# Patient Record
Sex: Male | Born: 1945 | Race: White | Hispanic: No | Marital: Married | Smoking: Current every day smoker
Health system: Southern US, Community
[De-identification: ages and names within clinical notes are randomized; demographics above are authoritative.]

## PROBLEM LIST (undated history)

## (undated) DIAGNOSIS — E7800 Pure hypercholesterolemia, unspecified: Secondary | ICD-10-CM

## (undated) DIAGNOSIS — I10 Essential (primary) hypertension: Secondary | ICD-10-CM

---

## 2016-04-01 ENCOUNTER — Encounter: Payer: Self-pay | Admitting: Emergency Medicine

## 2016-04-01 ENCOUNTER — Emergency Department
Admission: EM | Admit: 2016-04-01 | Discharge: 2016-04-01 | Disposition: A | Discharge status: Home / Self Care | Payer: Medicare Other | Attending: Emergency Medicine | Admitting: Emergency Medicine

## 2016-04-01 DIAGNOSIS — I10 Essential (primary) hypertension: Secondary | ICD-10-CM | POA: Insufficient documentation

## 2016-04-01 DIAGNOSIS — R339 Retention of urine, unspecified: Secondary | ICD-10-CM | POA: Diagnosis present

## 2016-04-01 DIAGNOSIS — F17200 Nicotine dependence, unspecified, uncomplicated: Secondary | ICD-10-CM | POA: Diagnosis not present

## 2016-04-01 HISTORY — DX: Pure hypercholesterolemia, unspecified: E78.00

## 2016-04-01 HISTORY — DX: Essential (primary) hypertension: I10

## 2016-04-01 LAB — URINALYSIS COMPLETE WITH MICROSCOPIC (ARMC ONLY)
BACTERIA UA: NONE SEEN
BILIRUBIN URINE: NEGATIVE
Glucose, UA: NEGATIVE mg/dL
Nitrite: NEGATIVE
PH: 6 (ref 5.0–8.0)
Protein, ur: 30 mg/dL — AB
SQUAMOUS EPITHELIAL / LPF: NONE SEEN
Specific Gravity, Urine: 1.018 (ref 1.005–1.030)

## 2016-04-01 MED ORDER — LIDOCAINE HCL 2 % EX GEL
1.0000 "application " | Freq: Once | CUTANEOUS | Status: AC
  Administered 2016-04-01: 1 via URETHRAL

## 2016-04-01 MED ORDER — LIDOCAINE HCL 2 % EX GEL
CUTANEOUS | Status: AC
  Administered 2016-04-01: 1 via URETHRAL
  Filled 2016-04-01: qty 10

## 2016-04-01 NOTE — ED Provider Notes (Signed)
[facility] Emergency Department Provider Note   ____________________________________________  Time seen: Approximately 11:27 PM  I have reviewed the triage vital signs and the nursing notes.   HISTORY  Chief Complaint Urinary Retention    HPI Jonathan Mora is a 70 y.o. male patient says she had radiofrequency heating of his prostate last week. He had the Foley pulled out this morning. He has been unable to pass urine for the last few hours and isn't extreme pain. He has had no other problems except for that. Nothing he does make some urine come out thing he does makes the pain any better again he has no other symptoms except for bladder pain from being unable to pass her urine.  } Past Medical History  Diagnosis Date  . High cholesterol   . Hypertension     There are no active problems to display for this patient.   History reviewed. No pertinent past surgical history.  No current outpatient prescriptions on file.  Allergies Sulfa antibiotics  History reviewed. No pertinent family history.  Social History Social History  Substance Use Topics  . Smoking status: Current Every Day Smoker  . Smokeless tobacco: None  . Alcohol Use: No    Review of Systems Constitutional: No fever/chills Eyes: No visual changes. ENT: No sore throat. Cardiovascular: Denies chest pain. Respiratory: Denies shortness of breath. Gastrointestinal:  abdominal pain.  No nausea, no vomiting.  No diarrhea.  No constipation. Genitourinary: Negative for dysuria. Musculoskeletal: Negative for back pain. Skin: Negative for rash. Neurological: Negative for headaches, focal weakness or numbness.  10-point ROS otherwise negative.  ____________________________________________   PHYSICAL EXAM:  VITAL SIGNS: ED Triage Vitals  Enc Vitals Group     BP --      Pulse --      Resp --      Temp --      Temp src --      SpO2 --      Weight --      Height --    Head Cir --      Peak Flow --      Pain Score 04/01/16 2145 10     Pain Loc --      Pain Edu? --      Excl. in [place]? --    Constitutional: Alert and oriented. Well appearing and in no acute distress. Eyes: Conjunctivae are normal. PERRL. EOMI. Head: Atraumatic. Nose: No congestion/rhinnorhea. Mouth/Throat: Mucous membranes are moist.  Oropharynx non-erythematous. Neck: No stridor.  Gastrointestinal: Soft C is distended and tender. No distention. No abdominal bruits. No CVA tenderness. Genitourinary: Normal uncircumcised male  Musculoskeletal: No lower extremity tenderness nor edema.  No joint effusions. Neurologic:  Normal speech and language. No gross focal neurologic deficits are appreciated. No gait instability. Skin:  Skin is warm, dry and intact. No rash noted. Psychiatric: Mood and affect are normal. Speech and behavior are normal.  ____________________________________________   LABS (all labs ordered are listed, but only abnormal results are displayed)  Labs Reviewed  URINALYSIS COMPLETEWITH MICROSCOPIC ([place]) - Abnormal; Notable for the following:    Color, Urine YELLOW (*)    APPearance CLEAR (*)    Ketones, ur TRACE (*)    Hgb urine dipstick 3+ (*)    Protein, ur 30 (*)    Leukocytes, UA TRACE (*)    All other components within normal limits   ____________________________________________  EKG   ____________________________________________  RADIOLOGY   ____________________________________________  PROCEDURES  Procedure(s) Nurse unable to pass the Foley therefore I was called. I instilled about 5 ml of Urojet lidocaine into the patient's penis urethra and held at shot for about a minute and then I instilled some lubricant and put more lubricant on the daily catheter which nurse obtained for me. I was then able to using gradual gentle pressure insert the Foley/daily into the patient's urethra until I was able to obtain urine. Patient experienced immediate  relief of his symptoms. ____________________________________________   INITIAL IMPRESSION / ASSESSMENT AND PLAN / ED COURSE  Pertinent labs & imaging results that were available during my care of the patient were reviewed by me and considered in my medical decision making (see chart for details).   ____________________________________________   FINAL CLINICAL IMPRESSION(S) / ED DIAGNOSES  Final diagnoses:  Urinary retention      NEW MEDICATIONS STARTED DURING THIS VISIT:  New Prescriptions   No medications on file     Note:  This document was prepared using Dragon voice recognition software and may include unintentional dictation errors.    Nena Polio, MD 04/01/16 [phone]

## 2016-04-01 NOTE — ED Notes (Signed)
Pt presents to ED via EMS for acute urinary retention after foley catheter removed this morning around 900. Pt states had a prostate procedure done about few weeks ago by Dr. Gilmore Laroche at [facility] in [place] and he was sent home with foley. Bladder scan showed >638m in bladder.

## 2016-08-02 ENCOUNTER — Other Ambulatory Visit: Payer: Self-pay | Admitting: Family Medicine

## 2016-08-02 DIAGNOSIS — Z136 Encounter for screening for cardiovascular disorders: Secondary | ICD-10-CM

## 2016-08-13 ENCOUNTER — Other Ambulatory Visit: Payer: Medicare Other

## 2016-08-13 ENCOUNTER — Ambulatory Visit
Admission: RE | Admit: 2016-08-13 | Discharge: 2016-08-13 | Disposition: A | Discharge status: Home / Self Care | Payer: Medicare Other | Source: Ambulatory Visit | Attending: Family Medicine | Admitting: Family Medicine

## 2016-08-13 DIAGNOSIS — Z136 Encounter for screening for cardiovascular disorders: Secondary | ICD-10-CM | POA: Diagnosis not present

## 2016-08-13 DIAGNOSIS — Z720 Tobacco use: Secondary | ICD-10-CM | POA: Insufficient documentation

## 2017-03-12 ENCOUNTER — Telehealth: Payer: Self-pay | Admitting: *Deleted

## 2017-03-12 DIAGNOSIS — Z87891 Personal history of nicotine dependence: Secondary | ICD-10-CM

## 2017-03-12 NOTE — Telephone Encounter (Signed)
Received referral for initial lung cancer screening scan. Contacted patient and obtained smoking history,(current, 50 pack year) as well as answering questions related to screening process. Patient denies signs of lung cancer such as weight loss or hemoptysis. Patient denies comorbidity that would prevent curative treatment if lung cancer were found. Patient is tentatively scheduled for shared decision making visit and CT scan on 03/18/17, pending insurance approval from business office.

## 2017-03-18 ENCOUNTER — Encounter: Payer: Self-pay | Admitting: Oncology

## 2017-03-18 ENCOUNTER — Ambulatory Visit
Admission: RE | Admit: 2017-03-18 | Discharge: 2017-03-18 | Disposition: A | Discharge status: Home / Self Care | Payer: Medicare Other | Source: Ambulatory Visit | Attending: Oncology | Admitting: Oncology

## 2017-03-18 ENCOUNTER — Inpatient Hospital Stay: Payer: Medicare Other | Attending: Oncology | Admitting: Oncology

## 2017-03-18 DIAGNOSIS — Z122 Encounter for screening for malignant neoplasm of respiratory organs: Secondary | ICD-10-CM | POA: Insufficient documentation

## 2017-03-18 DIAGNOSIS — Z87891 Personal history of nicotine dependence: Secondary | ICD-10-CM | POA: Insufficient documentation

## 2017-03-18 DIAGNOSIS — J439 Emphysema, unspecified: Secondary | ICD-10-CM | POA: Diagnosis not present

## 2017-03-18 DIAGNOSIS — I700 Atherosclerosis of aorta: Secondary | ICD-10-CM | POA: Diagnosis not present

## 2017-03-18 DIAGNOSIS — R918 Other nonspecific abnormal finding of lung field: Secondary | ICD-10-CM | POA: Diagnosis not present

## 2017-03-18 DIAGNOSIS — F17210 Nicotine dependence, cigarettes, uncomplicated: Secondary | ICD-10-CM | POA: Diagnosis not present

## 2017-03-21 ENCOUNTER — Telehealth: Payer: Self-pay | Admitting: *Deleted

## 2017-03-21 NOTE — Telephone Encounter (Signed)
Voicemail left in attempt to notify patient of LDCT lung cancer screening results with recommendation for 3 month follow up imaging. Also upon return call will be notified of incidental finding noted below and encouraged to discuss with PCP who will receive this report.  IMPRESSION: 1. Multiple large nodular appearing areas of architectural distortion are again noted throughout the right lung, most evident in the right upper lobe where they are associated with areas of cylindrical bronchiectasis, strongly favored to represent areas of chronic post infectious or inflammatory scarring. Strictly speaking, based on size alone, these are classified as Lung-RADS Category 4A, suspicious. Follow up low-dose chest CT without contrast in 3 months (please use the following order, "CT CHEST LCS NODULE FOLLOW-UP W/O CM") is recommended. Alternatively, PET may be considered but is not strongly recommended at this time. 2. Aortic atherosclerosis. 3. Mild diffuse bronchial wall thickening with mild centrilobular and paraseptal emphysema.

## 2017-03-23 DIAGNOSIS — Z87891 Personal history of nicotine dependence: Secondary | ICD-10-CM | POA: Insufficient documentation

## 2017-03-23 NOTE — Progress Notes (Signed)
In accordance with CMS guidelines, patient has met eligibility criteria including age, absence of signs or symptoms of lung cancer.  Social History  Substance Use Topics  . Smoking status: Current Every Day Smoker    Packs/day: 1.00    Years: 50.00  . Smokeless tobacco: Not on file  . Alcohol use No     A shared decision-making session was conducted prior to the performance of CT scan. This includes one or more decision aids, includes benefits and harms of screening, follow-up diagnostic testing, over-diagnosis, false positive rate, and total radiation exposure.  Counseling on the importance of adherence to annual lung cancer LDCT screening, impact of co-morbidities, and ability or willingness to undergo diagnosis and treatment is imperative for compliance of the program.  Counseling on the importance of continued smoking cessation for former smokers; the importance of smoking cessation for current smokers, and information about tobacco cessation interventions have been given to patient including [place] and [employer] quit [place] programs.  Written order for lung cancer screening with LDCT has been given to the patient and any and all questions have been answered to the best of my abilities.   Yearly follow up will be coordinated by Burgess Estelle, Thoracic Navigator.

## 2017-03-31 ENCOUNTER — Ambulatory Visit
Admission: RE | Admit: 2017-03-31 | Discharge: 2017-03-31 | Disposition: A | Discharge status: Home / Self Care | Payer: Self-pay | Source: Ambulatory Visit | Attending: Oncology | Admitting: Oncology

## 2017-03-31 ENCOUNTER — Other Ambulatory Visit: Payer: Self-pay | Admitting: Oncology

## 2017-03-31 DIAGNOSIS — C3400 Malignant neoplasm of unspecified main bronchus: Secondary | ICD-10-CM

## 2017-04-02 NOTE — Telephone Encounter (Signed)
After reviewing results of CT scan with patient, he indicated that he had a prior scan at a [facility] facility that had a similar abnormality. Those images were obtained and compared by the original radiologist that read the recent scan. The Lung rads finding has been changed to reflect a 12 follow up imaging recommendation. The patient has been notified.

## 2018-03-12 ENCOUNTER — Telehealth: Payer: Self-pay | Admitting: *Deleted

## 2018-03-12 NOTE — Telephone Encounter (Signed)
Left message for patient to notify them that it is time to schedule annual low dose lung cancer screening CT scan. Instructed patient to call back to verify information prior to the scan being scheduled.  

## 2018-03-16 ENCOUNTER — Telehealth: Payer: Self-pay | Admitting: *Deleted

## 2018-03-16 DIAGNOSIS — Z87891 Personal history of nicotine dependence: Secondary | ICD-10-CM

## 2018-03-16 DIAGNOSIS — Z122 Encounter for screening for malignant neoplasm of respiratory organs: Secondary | ICD-10-CM

## 2018-03-16 NOTE — Telephone Encounter (Signed)
Notified patient that annual lung cancer screening low dose CT scan is due currently or will be in near future. Confirmed that patient is within the age range of 55-77, and asymptomatic, (no signs or symptoms of lung cancer). Patient denies illness that would prevent curative treatment for lung cancer if found. Verified smoking history, (current, 50.25 pack year). The shared decision making visit was done 03/18/17. Patient is agreeable for CT scan being scheduled.

## 2018-03-26 ENCOUNTER — Ambulatory Visit
Admission: RE | Admit: 2018-03-26 | Discharge: 2018-03-26 | Disposition: A | Discharge status: Home / Self Care | Payer: Medicare Other | Source: Ambulatory Visit | Attending: Nurse Practitioner | Admitting: Nurse Practitioner

## 2018-03-26 DIAGNOSIS — I700 Atherosclerosis of aorta: Secondary | ICD-10-CM | POA: Insufficient documentation

## 2018-03-26 DIAGNOSIS — J439 Emphysema, unspecified: Secondary | ICD-10-CM | POA: Insufficient documentation

## 2018-03-26 DIAGNOSIS — Z122 Encounter for screening for malignant neoplasm of respiratory organs: Secondary | ICD-10-CM | POA: Diagnosis present

## 2018-03-26 DIAGNOSIS — Z87891 Personal history of nicotine dependence: Secondary | ICD-10-CM | POA: Diagnosis present

## 2018-03-30 ENCOUNTER — Encounter: Payer: Self-pay | Admitting: *Deleted

## 2019-02-20 ENCOUNTER — Encounter: Payer: Self-pay | Admitting: *Deleted

## 2019-02-25 ENCOUNTER — Encounter: Payer: Self-pay | Admitting: *Deleted

## 2019-05-05 ENCOUNTER — Telehealth: Payer: Self-pay | Admitting: *Deleted

## 2019-05-05 DIAGNOSIS — Z87891 Personal history of nicotine dependence: Secondary | ICD-10-CM

## 2019-05-05 DIAGNOSIS — Z122 Encounter for screening for malignant neoplasm of respiratory organs: Secondary | ICD-10-CM

## 2019-05-05 NOTE — Telephone Encounter (Signed)
Patient has been notified that annual lung cancer screening low dose CT scan is due currently or will be in near future. Confirmed that patient is within the age range of 55-77, and asymptomatic, (no signs or symptoms of lung cancer). Patient denies illness that would prevent curative treatment for lung cancer if found. Verified smoking history, (former, quit 01/31/19, 50.25 pack year). The shared decision making visit was done 03/18/17. Patient is agreeable for CT scan being scheduled.

## 2019-05-11 ENCOUNTER — Other Ambulatory Visit: Payer: Self-pay

## 2019-05-11 ENCOUNTER — Ambulatory Visit
Admission: RE | Admit: 2019-05-11 | Discharge: 2019-05-11 | Disposition: A | Discharge status: Home / Self Care | Payer: Medicare Other | Source: Ambulatory Visit | Attending: Nurse Practitioner | Admitting: Nurse Practitioner

## 2019-05-11 DIAGNOSIS — Z87891 Personal history of nicotine dependence: Secondary | ICD-10-CM | POA: Diagnosis present

## 2019-05-11 DIAGNOSIS — Z122 Encounter for screening for malignant neoplasm of respiratory organs: Secondary | ICD-10-CM | POA: Diagnosis present

## 2019-05-12 ENCOUNTER — Encounter: Payer: Self-pay | Admitting: *Deleted

## 2019-10-20 ENCOUNTER — Other Ambulatory Visit: Payer: Self-pay | Admitting: Family Medicine

## 2019-10-20 DIAGNOSIS — R17 Unspecified jaundice: Secondary | ICD-10-CM

## 2019-10-27 ENCOUNTER — Encounter (INDEPENDENT_AMBULATORY_CARE_PROVIDER_SITE_OTHER): Payer: Self-pay

## 2019-10-27 ENCOUNTER — Ambulatory Visit
Admission: RE | Admit: 2019-10-27 | Discharge: 2019-10-27 | Disposition: A | Discharge status: Home / Self Care | Payer: Medicare Other | Source: Ambulatory Visit | Attending: Family Medicine | Admitting: Family Medicine

## 2019-10-27 ENCOUNTER — Other Ambulatory Visit: Payer: Self-pay

## 2019-10-27 DIAGNOSIS — R17 Unspecified jaundice: Secondary | ICD-10-CM | POA: Diagnosis not present

## 2019-11-15 ENCOUNTER — Other Ambulatory Visit: Payer: Self-pay | Admitting: Family Medicine

## 2019-11-15 DIAGNOSIS — N281 Cyst of kidney, acquired: Secondary | ICD-10-CM

## 2020-04-24 ENCOUNTER — Ambulatory Visit
Admission: RE | Admit: 2020-04-24 | Discharge: 2020-04-24 | Disposition: A | Discharge status: Home / Self Care | Payer: Medicare Other | Source: Ambulatory Visit | Attending: Family Medicine | Admitting: Family Medicine

## 2020-04-24 ENCOUNTER — Other Ambulatory Visit: Payer: Self-pay

## 2020-04-24 DIAGNOSIS — N281 Cyst of kidney, acquired: Secondary | ICD-10-CM | POA: Insufficient documentation

## 2020-04-26 ENCOUNTER — Ambulatory Visit: Payer: Medicare Other

## 2020-05-03 ENCOUNTER — Telehealth: Payer: Self-pay

## 2020-05-03 NOTE — Telephone Encounter (Signed)
Patient has been notified that the low dose lung cancer screening CT scan is due currently or will be in near future.  He is being scheduled for prostate MRI in the next few weeks and would like to wait for the lung screening CT scan.

## 2020-06-26 ENCOUNTER — Telehealth: Payer: Self-pay

## 2020-06-26 NOTE — Telephone Encounter (Signed)
Message left notifying patient that it is time to schedule the low dose lung cancer screening CT scan.  Instructed patient to return call to Burgess Estelle at [phone] to verify information prior to CT scan being scheduled.

## 2020-06-29 ENCOUNTER — Telehealth: Payer: Self-pay | Admitting: *Deleted

## 2020-06-29 DIAGNOSIS — Z87891 Personal history of nicotine dependence: Secondary | ICD-10-CM

## 2020-06-29 DIAGNOSIS — Z122 Encounter for screening for malignant neoplasm of respiratory organs: Secondary | ICD-10-CM

## 2020-06-29 NOTE — Telephone Encounter (Signed)
Patient has been notified that annual lung cancer screening low dose CT scan is due currently or will be in near future. Confirmed that patient is within the age range of 55-77, and asymptomatic, (no signs or symptoms of lung cancer). Patient denies illness that would prevent curative treatment for lung cancer if found. Verified smoking history, (former, quit 01/31/19, 50.25 pack year). The shared decision making visit was done 03/18/17. Patient is agreeable for CT scan being scheduled.

## 2020-07-19 ENCOUNTER — Ambulatory Visit
Admission: RE | Admit: 2020-07-19 | Discharge: 2020-07-19 | Disposition: A | Discharge status: Home / Self Care | Payer: Medicare Other | Source: Ambulatory Visit | Attending: Nurse Practitioner | Admitting: Nurse Practitioner

## 2020-07-19 ENCOUNTER — Other Ambulatory Visit: Payer: Self-pay

## 2020-07-19 DIAGNOSIS — Z87891 Personal history of nicotine dependence: Secondary | ICD-10-CM | POA: Insufficient documentation

## 2020-07-19 DIAGNOSIS — Z122 Encounter for screening for malignant neoplasm of respiratory organs: Secondary | ICD-10-CM | POA: Insufficient documentation

## 2020-07-24 ENCOUNTER — Encounter: Payer: Self-pay | Admitting: *Deleted

## 2020-10-17 IMAGING — CT CT CHEST LUNG CANCER SCREENING LOW DOSE W/O CM
2 of 5 series · 15 of 40 positions shown, 18 images · non-contrast
Comparison: 05/11/2019 screening chest CT.

CLINICAL DATA: 74-year-old asymptomatic male former smoker with
50.25 pack-year smoking history, quit smoking 1 year prior.

EXAM:
CT CHEST WITHOUT CONTRAST LOW-DOSE FOR LUNG CANCER SCREENING
TECHNIQUE: Multidetector CT imaging of the chest was performed following the
standard protocol without IV contrast.

[Series 3: lung 1.00 · axial · 0.68mm/px · z∈[-1234,-893]mm · 12 of 377 slices shown, 15 images]
[im 18/377  mediastinal]
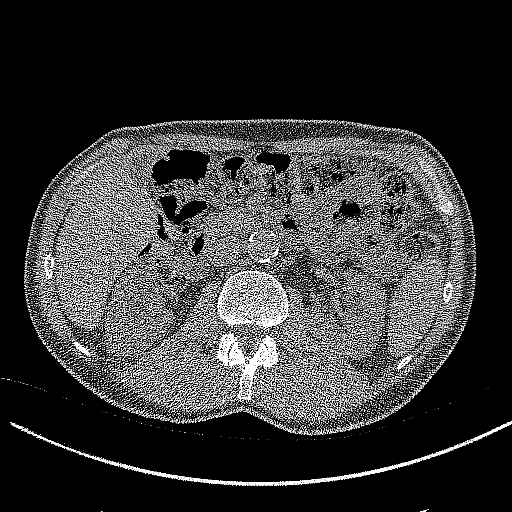
[im 18/377  lung]
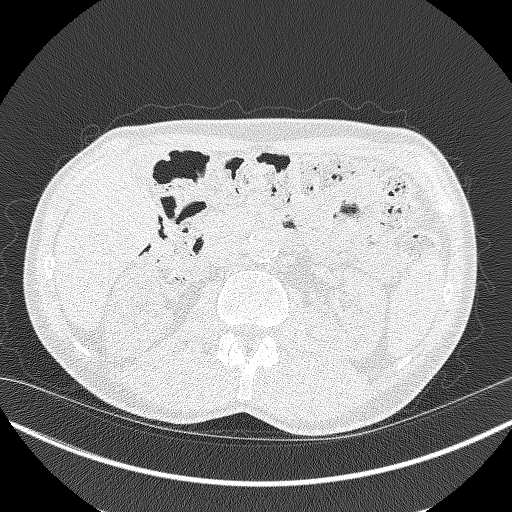
[im 52/377  lung]
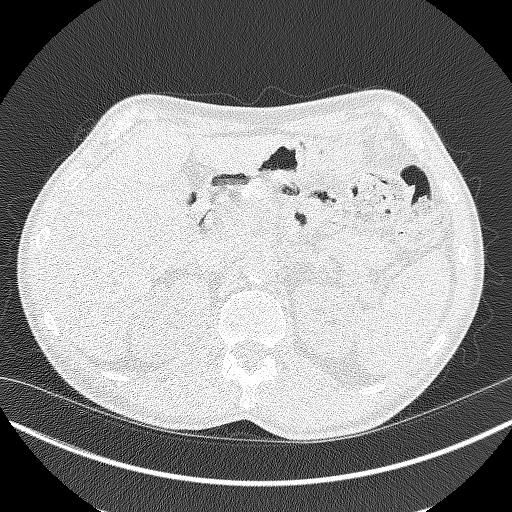
[im 86/377  lung]
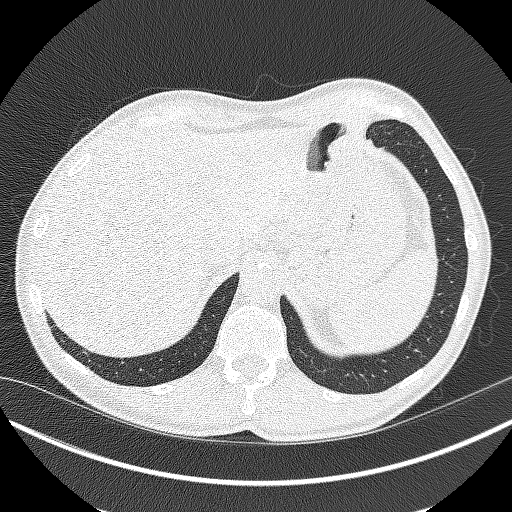
[im 120/377  lung]
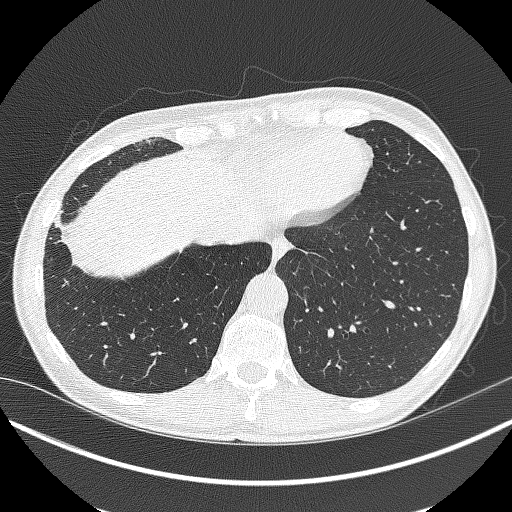
[im 137/377  mediastinal]
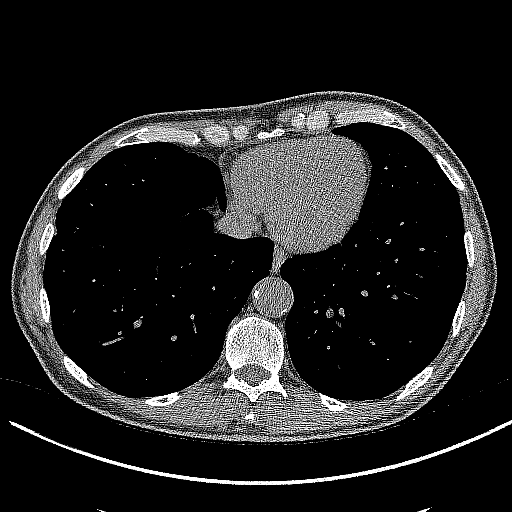
[im 137/377  lung]
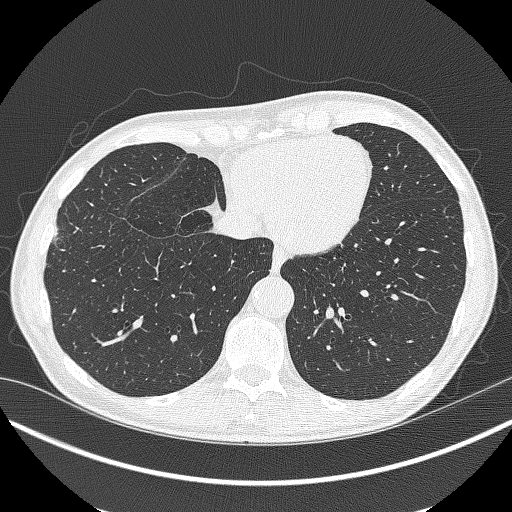
[im 171/377  lung]
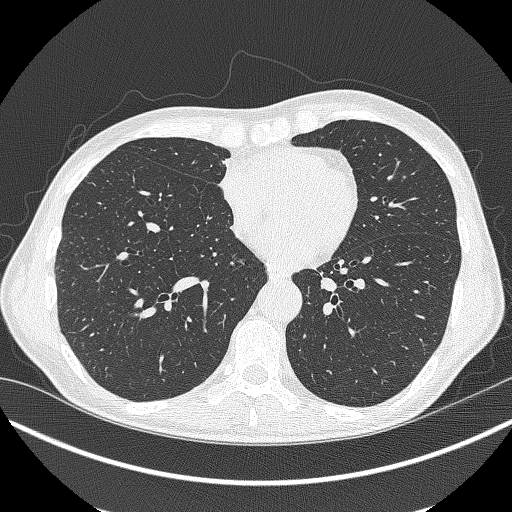
[im 206/377  lung]
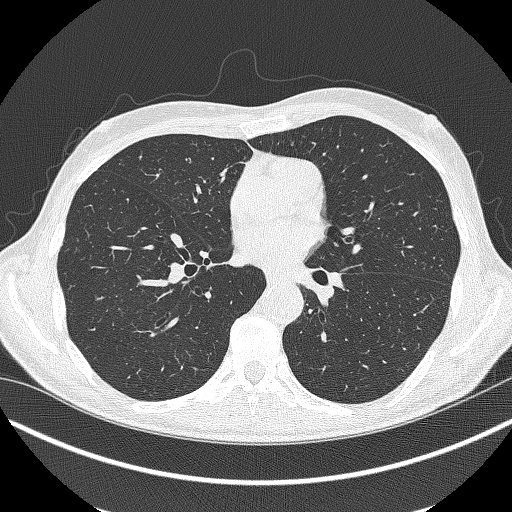
[im 240/377  lung]
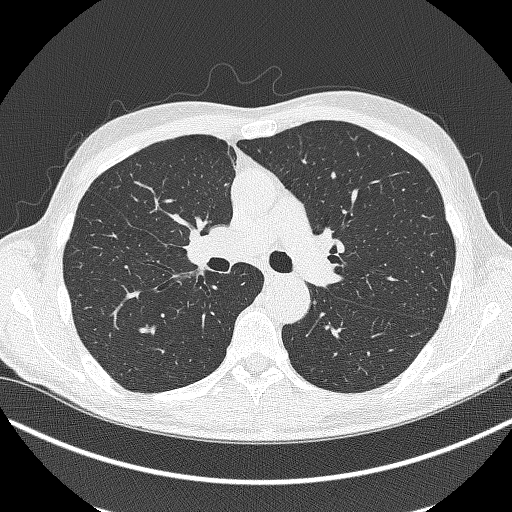
[im 257/377  mediastinal]
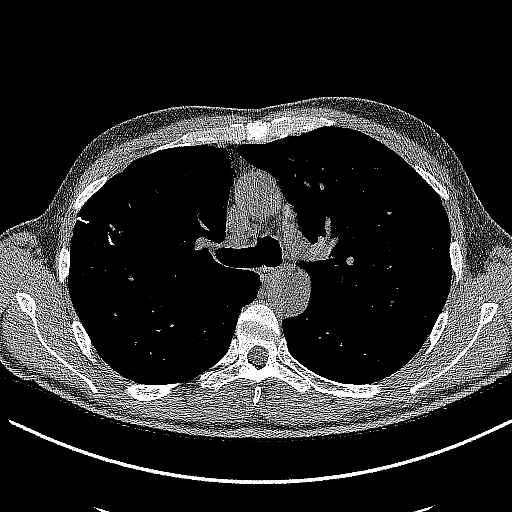
[im 257/377  lung]
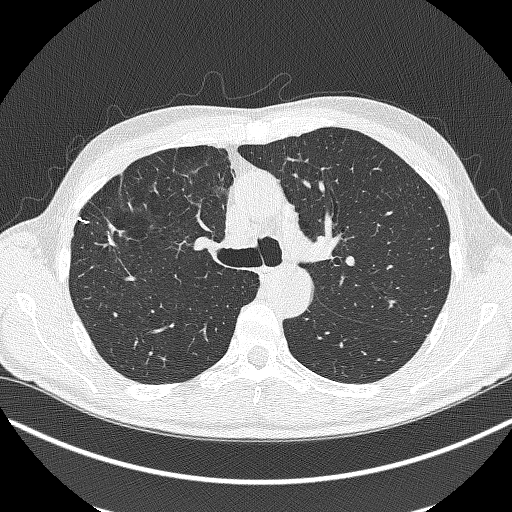
[im 291/377  lung]
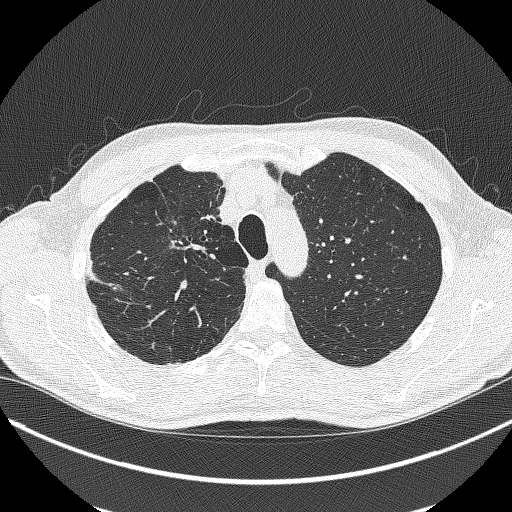
[im 325/377  lung]
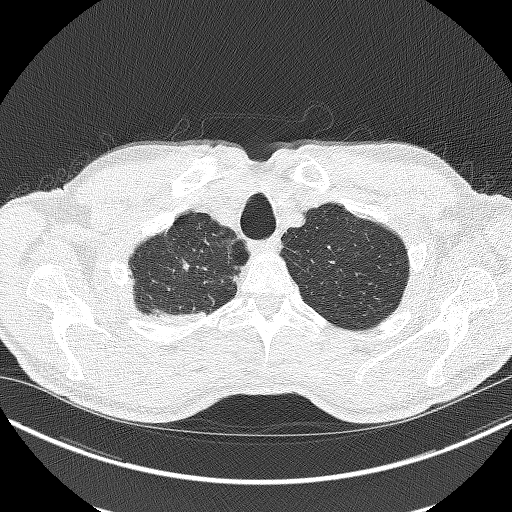
[im 359/377  lung]
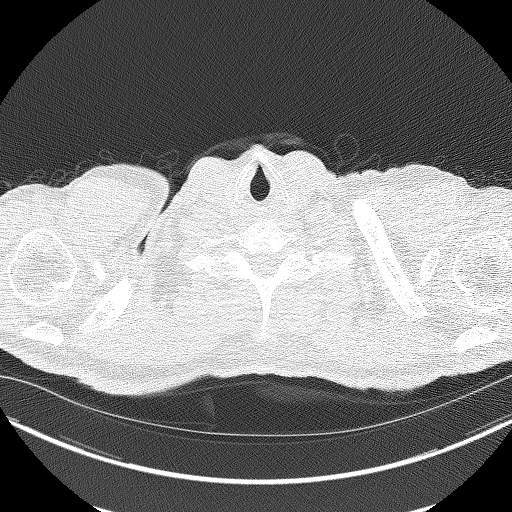

[Series 4: coronals lung 1.00 cor · coronal · 0.68mm/px · 3 of 237 slices shown]
[im 48/237  lung]
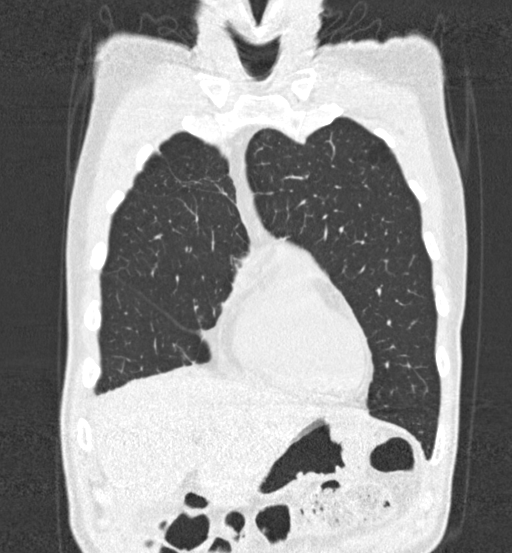
[im 95/237  lung]
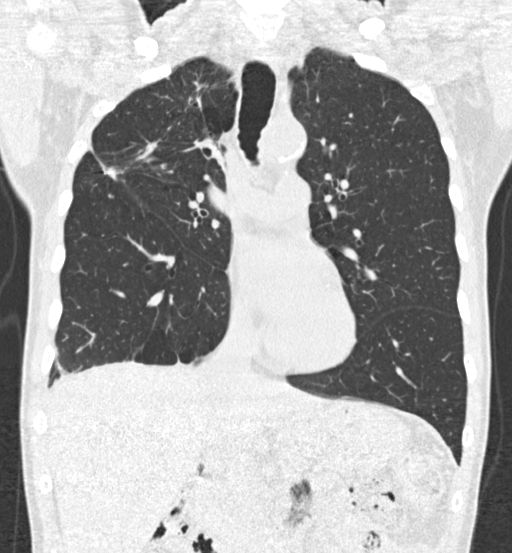
[im 142/237  lung]
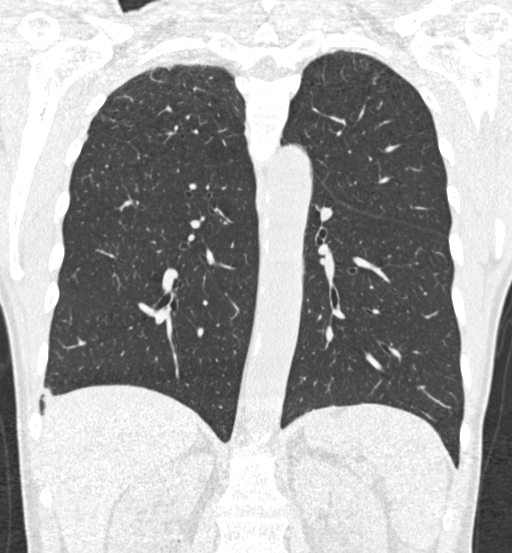

[15 of 40 positions shown; findings below may reference images not displayed]

FINDINGS: Cardiovascular: Normal heart size. No significant pericardial
effusion/thickening. Atherosclerotic nonaneurysmal thoracic aorta.
Normal caliber pulmonary arteries.

Mediastinum/Nodes: No discrete thyroid nodules. Unremarkable
esophagus. No pathologically enlarged axillary, mediastinal or hilar
lymph nodes, noting limited sensitivity for the detection of hilar
adenopathy on this noncontrast study.

Lungs/Pleura: No pneumothorax. No pleural effusion. Moderate
centrilobular and paraseptal emphysema. Stable postsurgical changes
from apparent wedge resection in the right upper lobe. No acute
consolidative airspace disease or lung masses. No significant growth
of previously visualized scattered pulmonary nodules. No new
significant pulmonary nodules.

Upper abdomen: No acute abnormality.

Musculoskeletal:  No aggressive appearing focal osseous lesions.
IMPRESSION: 1. Lung-RADS 2, benign appearance or behavior. Continue annual
screening with low-dose chest CT without contrast in 12 months.
2. Aortic Atherosclerosis ([id number]-YR9.9) and Emphysema ([id number].X).

## 2021-12-27 ENCOUNTER — Telehealth: Payer: Self-pay | Admitting: Acute Care

## 2021-12-27 NOTE — Telephone Encounter (Signed)
Left voicemail and call back number to schedule annual LDCT
